# Patient Record
Sex: Female | Born: 1980 | Race: Black or African American | Hispanic: No | Marital: Married | Smoking: Never smoker
Health system: Southern US, Community
[De-identification: ages and names within clinical notes are randomized; demographics above are authoritative.]

## PROBLEM LIST (undated history)

## (undated) DIAGNOSIS — J45909 Unspecified asthma, uncomplicated: Secondary | ICD-10-CM

## (undated) HISTORY — DX: Unspecified asthma, uncomplicated: J45.909

## (undated) HISTORY — PX: CHOLECYSTECTOMY: SHX55

---

## 2001-12-18 HISTORY — PX: TUBAL LIGATION: SHX77

## 2004-04-10 ENCOUNTER — Emergency Department (HOSPITAL_COMMUNITY): Admission: EM | Admit: 2004-04-10 | Discharge: 2004-04-10 | Payer: Self-pay | Admitting: Emergency Medicine

## 2009-11-09 ENCOUNTER — Ambulatory Visit: Payer: Self-pay | Admitting: Diagnostic Radiology

## 2009-11-09 ENCOUNTER — Emergency Department (HOSPITAL_BASED_OUTPATIENT_CLINIC_OR_DEPARTMENT_OTHER): Admission: EM | Admit: 2009-11-09 | Discharge: 2009-11-09 | Payer: Self-pay | Admitting: Emergency Medicine

## 2011-06-04 ENCOUNTER — Emergency Department (HOSPITAL_BASED_OUTPATIENT_CLINIC_OR_DEPARTMENT_OTHER)
Admission: EM | Admit: 2011-06-04 | Discharge: 2011-06-04 | Disposition: A | Discharge status: Home / Self Care | Payer: Self-pay | Attending: Emergency Medicine | Admitting: Emergency Medicine

## 2011-06-04 DIAGNOSIS — M722 Plantar fascial fibromatosis: Secondary | ICD-10-CM | POA: Insufficient documentation

## 2014-09-21 ENCOUNTER — Emergency Department (HOSPITAL_BASED_OUTPATIENT_CLINIC_OR_DEPARTMENT_OTHER)
Admission: EM | Admit: 2014-09-21 | Discharge: 2014-09-22 | Disposition: A | Discharge status: Home / Self Care | Payer: No Typology Code available for payment source | Attending: Emergency Medicine | Admitting: Emergency Medicine

## 2014-09-21 ENCOUNTER — Encounter (HOSPITAL_BASED_OUTPATIENT_CLINIC_OR_DEPARTMENT_OTHER): Payer: Self-pay | Admitting: Emergency Medicine

## 2014-09-21 DIAGNOSIS — S161XXA Strain of muscle, fascia and tendon at neck level, initial encounter: Secondary | ICD-10-CM

## 2014-09-21 DIAGNOSIS — Y9389 Activity, other specified: Secondary | ICD-10-CM | POA: Insufficient documentation

## 2014-09-21 DIAGNOSIS — S0990XA Unspecified injury of head, initial encounter: Secondary | ICD-10-CM | POA: Insufficient documentation

## 2014-09-21 DIAGNOSIS — Y92410 Unspecified street and highway as the place of occurrence of the external cause: Secondary | ICD-10-CM | POA: Insufficient documentation

## 2014-09-21 MED ORDER — CYCLOBENZAPRINE HCL 10 MG PO TABS
5.0000 mg | ORAL_TABLET | Freq: Once | ORAL | Status: DC
  Filled 2014-09-21: qty 1

## 2014-09-21 MED ORDER — IBUPROFEN 400 MG PO TABS
600.0000 mg | ORAL_TABLET | Freq: Once | ORAL | Status: AC
  Administered 2014-09-22: 600 mg via ORAL
  Filled 2014-09-21 (×2): qty 1
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
## 2014-09-21 NOTE — ED Notes (Signed)
NP at bedside.
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
## 2014-09-22 ENCOUNTER — Emergency Department (HOSPITAL_BASED_OUTPATIENT_CLINIC_OR_DEPARTMENT_OTHER): Payer: No Typology Code available for payment source

## 2014-09-22 ENCOUNTER — Encounter (HOSPITAL_BASED_OUTPATIENT_CLINIC_OR_DEPARTMENT_OTHER): Payer: Self-pay | Admitting: Emergency Medicine

## 2014-09-22 MED ORDER — CYCLOBENZAPRINE HCL 10 MG PO TABS: 10.0000 mg | ORAL_TABLET | Freq: Two times a day (BID) | ORAL | Status: DC | PRN

## 2014-09-22 MED ORDER — NAPROXEN 500 MG PO TABS: 500.0000 mg | ORAL_TABLET | Freq: Two times a day (BID) | ORAL | Status: DC

## 2014-09-22 NOTE — ED Notes (Signed)
Called radiology to check on delay in xr result.  Darius is checking on it.

## 2014-09-24 NOTE — ED Provider Notes (Signed)
Medical screening examination/treatment/procedure(s) were performed by non-physician practitioner and as supervising physician I was immediately available for consultation/collaboration.   EKG Interpretation None       Sion Thane K Merita Hawks-Rasch, MD 09/24/14 2329

## 2014-12-02 ENCOUNTER — Emergency Department (HOSPITAL_COMMUNITY)
Admission: EM | Admit: 2014-12-02 | Discharge: 2014-12-02 | Disposition: A | Discharge status: Home / Self Care | Payer: No Typology Code available for payment source | Attending: Emergency Medicine | Admitting: Emergency Medicine

## 2014-12-02 ENCOUNTER — Encounter (HOSPITAL_COMMUNITY): Payer: Self-pay | Admitting: Emergency Medicine

## 2014-12-02 DIAGNOSIS — R1013 Epigastric pain: Secondary | ICD-10-CM | POA: Insufficient documentation

## 2014-12-02 DIAGNOSIS — Z791 Long term (current) use of non-steroidal anti-inflammatories (NSAID): Secondary | ICD-10-CM | POA: Insufficient documentation

## 2014-12-02 DIAGNOSIS — D649 Anemia, unspecified: Secondary | ICD-10-CM | POA: Insufficient documentation

## 2014-12-02 DIAGNOSIS — Z9851 Tubal ligation status: Secondary | ICD-10-CM | POA: Insufficient documentation

## 2014-12-02 DIAGNOSIS — Z3202 Encounter for pregnancy test, result negative: Secondary | ICD-10-CM | POA: Insufficient documentation

## 2014-12-02 DIAGNOSIS — Z9089 Acquired absence of other organs: Secondary | ICD-10-CM | POA: Insufficient documentation

## 2014-12-02 LAB — CBC WITH DIFFERENTIAL/PLATELET
BASOS ABS: 0 10*3/uL (ref 0.0–0.1)
Basophils Relative: 0 % (ref 0–1)
EOS PCT: 1 % (ref 0–5)
Eosinophils Absolute: 0.1 10*3/uL (ref 0.0–0.7)
HEMATOCRIT: 32.3 % — AB (ref 36.0–46.0)
Hemoglobin: 9.7 g/dL — ABNORMAL LOW (ref 12.0–15.0)
LYMPHS ABS: 2.1 10*3/uL (ref 0.7–4.0)
LYMPHS PCT: 26 % (ref 12–46)
MCH: 21.8 pg — ABNORMAL LOW (ref 26.0–34.0)
MCHC: 30 g/dL (ref 30.0–36.0)
MCV: 72.7 fL — AB (ref 78.0–100.0)
MONOS PCT: 7 % (ref 3–12)
Monocytes Absolute: 0.6 10*3/uL (ref 0.1–1.0)
NEUTROS ABS: 5.2 10*3/uL (ref 1.7–7.7)
Neutrophils Relative %: 66 % (ref 43–77)
Platelets: 433 10*3/uL — ABNORMAL HIGH (ref 150–400)
RBC: 4.44 MIL/uL (ref 3.87–5.11)
RDW: 15.9 % — AB (ref 11.5–15.5)
WBC: 8 10*3/uL (ref 4.0–10.5)

## 2014-12-02 LAB — URINALYSIS, ROUTINE W REFLEX MICROSCOPIC
BILIRUBIN URINE: NEGATIVE
GLUCOSE, UA: NEGATIVE mg/dL
HGB URINE DIPSTICK: NEGATIVE
Ketones, ur: 15 mg/dL — AB
Leukocytes, UA: NEGATIVE
Nitrite: NEGATIVE
Protein, ur: NEGATIVE mg/dL
SPECIFIC GRAVITY, URINE: 1.027 (ref 1.005–1.030)
Urobilinogen, UA: 0.2 mg/dL (ref 0.0–1.0)
pH: 5.5 (ref 5.0–8.0)

## 2014-12-02 LAB — COMPREHENSIVE METABOLIC PANEL
ALBUMIN: 4.1 g/dL (ref 3.5–5.2)
ALK PHOS: 80 U/L (ref 39–117)
ALT: 13 U/L (ref 0–35)
AST: 16 U/L (ref 0–37)
Anion gap: 15 (ref 5–15)
BILIRUBIN TOTAL: 0.2 mg/dL — AB (ref 0.3–1.2)
BUN: 9 mg/dL (ref 6–23)
CHLORIDE: 103 meq/L (ref 96–112)
CO2: 21 meq/L (ref 19–32)
CREATININE: 0.95 mg/dL (ref 0.50–1.10)
Calcium: 9.5 mg/dL (ref 8.4–10.5)
GFR calc Af Amer: 90 mL/min — ABNORMAL LOW (ref >90)
GFR, EST NON AFRICAN AMERICAN: 77 mL/min — AB (ref >90)
Glucose, Bld: 94 mg/dL (ref 70–99)
POTASSIUM: 4.4 meq/L (ref 3.7–5.3)
Sodium: 139 mEq/L (ref 137–147)
Total Protein: 7.1 g/dL (ref 6.0–8.3)

## 2014-12-02 LAB — POC URINE PREG, ED: Preg Test, Ur: NEGATIVE

## 2014-12-02 LAB — LIPASE, BLOOD: Lipase: 25 U/L (ref 11–59)

## 2014-12-02 MED ORDER — PANTOPRAZOLE SODIUM 40 MG PO TBEC: 40.0000 mg | DELAYED_RELEASE_TABLET | Freq: Once | ORAL | Status: DC

## 2014-12-02 MED ORDER — OMEPRAZOLE 20 MG PO CPDR: 20.0000 mg | DELAYED_RELEASE_CAPSULE | Freq: Every day | ORAL | Status: DC

## 2014-12-02 MED ORDER — GI COCKTAIL ~~LOC~~
30.0000 mL | Freq: Once | ORAL | Status: AC
  Administered 2014-12-02: 30 mL via ORAL
  Filled 2014-12-02: qty 30

## 2014-12-02 MED ORDER — ONDANSETRON 4 MG PO TBDP
4.0000 mg | ORAL_TABLET | Freq: Once | ORAL | Status: AC
Start: 2014-12-02 — End: 2014-12-02
  Administered 2014-12-02: 4 mg via ORAL
  Filled 2014-12-02: qty 1

## 2014-12-02 MED ORDER — FERROUS SULFATE 325 (65 FE) MG PO TABS: 325.0000 mg | ORAL_TABLET | Freq: Every day | ORAL | Status: DC

## 2014-12-02 NOTE — ED Provider Notes (Signed)
CSN: [id number]     Arrival date & time 12/02/14  1424 History   First MD Initiated Contact with Patient 12/02/14 1704     Chief Complaint  Patient presents with  . Abdominal Pain     (Consider location/radiation/quality/duration/timing/severity/associated sxs/prior Treatment) HPI Comments: Patient presents today with epigastric abdominal pain.  She reports that the pain has been intermittent over the past 3 days.  She describes it as a burning pain.  Pain does not radiate.  She has taken Advil for the pain without relief.  Pain is worse after eating.  She reports associated nausea and one episode of vomiting.  No blood in her emesis.  She denies chest pain, cough, fever, chills, diarrhea, constipation, urinary symptoms, or SOB.  Past abdominal surgeries include Cholecystectomy, which she reports was done approximately 12 years ago.  She denies prior history of PUD or GERD.    Patient is a 33 y.o. female presenting with abdominal pain. The history is provided by the patient.  Abdominal Pain   History reviewed. No pertinent past medical history. Past Surgical History  Procedure Laterality Date  . Tubal ligation    . Cholecystectomy     History reviewed. No pertinent family history. History  Substance Use Topics  . Smoking status: Never Smoker   . Smokeless tobacco: Not on file  . Alcohol Use: No   OB History    No data available     Review of Systems  Gastrointestinal: Positive for abdominal pain.  All other systems reviewed and are negative.     Allergies  Review of patient's allergies indicates no known allergies.  Home Medications   Prior to Admission medications   Medication Sig Start Date End Date Taking? Authorizing Provider  cyclobenzaprine (FLEXERIL) 10 MG tablet Take 1 tablet (10 mg total) by mouth 2 (two) times daily as needed for muscle spasms. 09/22/14   Hope Bunnie Pion, NP  naproxen (NAPROSYN) 500 MG tablet Take 1 tablet (500 mg total) by mouth 2 (two) times  daily. 09/22/14   Hope Bunnie Pion, NP   BP 127/79 mmHg  Pulse 82  Temp(Src) 98.2 F (36.8 C) (Oral)  Resp 11  Ht 5' (1.524 m)  Wt 160 lb (72.576 kg)  BMI 31.25 kg/m2  SpO2 100% Physical Exam  Constitutional: She appears well-developed and well-nourished.  HENT:  Head: Normocephalic and atraumatic.  Mouth/Throat: Oropharynx is clear and moist.  Neck: Normal range of motion. Neck supple.  Cardiovascular: Normal rate, regular rhythm and normal heart sounds.   Pulmonary/Chest: Effort normal and breath sounds normal. No respiratory distress. She has no wheezes. She has no rales. She exhibits no tenderness.  Abdominal: Soft. Bowel sounds are normal. She exhibits no distension and no mass. There is tenderness in the epigastric area. There is no rebound and no guarding.  Musculoskeletal: Normal range of motion.  Neurological: She is alert.  Skin: Skin is warm and dry.  Psychiatric: She has a normal mood and affect.  Nursing note and vitals reviewed.   ED Course  Procedures (including critical care time) Labs Review Labs Reviewed  CBC WITH DIFFERENTIAL - Abnormal; Notable for the following:    Hemoglobin 9.7 (*)    HCT 32.3 (*)    MCV 72.7 (*)    MCH 21.8 (*)    RDW 15.9 (*)    Platelets 433 (*)    All other components within normal limits  COMPREHENSIVE METABOLIC PANEL - Abnormal; Notable for the following:    Total  Bilirubin 0.2 (*)    GFR calc non Af Amer 77 (*)    GFR calc Af Amer 90 (*)    All other components within normal limits  URINALYSIS, ROUTINE W REFLEX MICROSCOPIC - Abnormal; Notable for the following:    Ketones, ur 15 (*)    All other components within normal limits  LIPASE, BLOOD  POC URINE PREG, ED    Imaging Review No results found.   EKG Interpretation None     6:00 PM Reassessed patient.  She reports that the pain has improved at this time.  On exam, mild tenderness to palpation of the epigastric area.  No rebound or guarding.   MDM   Final  diagnoses:  None   Patient presents today with a chief complaint of epigastric abdominal pain.  Pain is worse after eating.  She has had a prior Cholecystectomy approximately 12 years ago.   Labs unremarkable.  Patient improved after given GI cocktail.  Patient tolerating PO liquids.  Patient stable for discharge.  Patient given Rx for Prilosec and given referral to GI.  Return precautions given.      Hyman Bible, PA-C 12/03/14 0114  Quintella Reichert, MD 12/03/14 [phone]

## 2014-12-02 NOTE — ED Notes (Signed)
Pt c/o upper to mid abd pain x 3 days worse after eating with some N/V

## 2015-04-29 ENCOUNTER — Ambulatory Visit: Payer: Self-pay | Attending: Family Medicine | Admitting: Family Medicine

## 2015-04-29 ENCOUNTER — Encounter: Payer: Self-pay | Admitting: Family Medicine

## 2015-04-29 VITALS — BP 120/73 | HR 92 | Temp 98.3°F | Resp 16 | Ht 60.0 in | Wt 159.0 lb

## 2015-04-29 DIAGNOSIS — Z114 Encounter for screening for human immunodeficiency virus [HIV]: Secondary | ICD-10-CM

## 2015-04-29 DIAGNOSIS — R0602 Shortness of breath: Secondary | ICD-10-CM

## 2015-04-29 DIAGNOSIS — Z23 Encounter for immunization: Secondary | ICD-10-CM

## 2015-04-29 DIAGNOSIS — R1031 Right lower quadrant pain: Secondary | ICD-10-CM

## 2015-04-29 LAB — IRON AND TIBC
%SAT: 5 % — AB (ref 20–55)
Iron: 22 ug/dL — ABNORMAL LOW (ref 42–145)
TIBC: 488 ug/dL — AB (ref 250–470)
UIBC: 466 ug/dL — ABNORMAL HIGH (ref 125–400)

## 2015-04-29 LAB — CBC
HCT: 31.5 % — ABNORMAL LOW (ref 36.0–46.0)
HEMOGLOBIN: 9.2 g/dL — AB (ref 12.0–15.0)
MCH: 19.7 pg — AB (ref 26.0–34.0)
MCHC: 29.2 g/dL — AB (ref 30.0–36.0)
MCV: 67.6 fL — ABNORMAL LOW (ref 78.0–100.0)
MPV: 9.7 fL (ref 8.6–12.4)
Platelets: 431 10*3/uL — ABNORMAL HIGH (ref 150–400)
RBC: 4.66 MIL/uL (ref 3.87–5.11)
RDW: 17.5 % — AB (ref 11.5–15.5)
WBC: 8.5 10*3/uL (ref 4.0–10.5)

## 2015-04-29 LAB — POCT URINALYSIS DIPSTICK
Bilirubin, UA: NEGATIVE
Glucose, UA: NEGATIVE
KETONES UA: NEGATIVE
Leukocytes, UA: NEGATIVE
Nitrite, UA: NEGATIVE
PROTEIN UA: 30
RBC UA: NEGATIVE
SPEC GRAV UA: 1.025
UROBILINOGEN UA: 0.2
pH, UA: 7

## 2015-04-29 LAB — COMPLETE METABOLIC PANEL WITH GFR
ALBUMIN: 4.2 g/dL (ref 3.5–5.2)
ALK PHOS: 65 U/L (ref 39–117)
ALT: 12 U/L (ref 0–35)
AST: 13 U/L (ref 0–37)
BILIRUBIN TOTAL: 0.4 mg/dL (ref 0.2–1.2)
BUN: 8 mg/dL (ref 6–23)
CO2: 24 meq/L (ref 19–32)
Calcium: 9.2 mg/dL (ref 8.4–10.5)
Chloride: 106 mEq/L (ref 96–112)
Creat: 0.93 mg/dL (ref 0.50–1.10)
GFR, EST NON AFRICAN AMERICAN: 80 mL/min
GFR, Est African American: >89 mL/min
Glucose, Bld: 82 mg/dL (ref 70–99)
Potassium: 4.3 mEq/L (ref 3.5–5.3)
SODIUM: 139 meq/L (ref 135–145)
TOTAL PROTEIN: 6.8 g/dL (ref 6.0–8.3)

## 2015-04-29 LAB — POCT URINE PREGNANCY: PREG TEST UR: NEGATIVE

## 2015-04-29 LAB — FERRITIN: FERRITIN: 5 ng/mL — AB (ref 10–291)

## 2015-04-29 MED ORDER — FERROUS SULFATE 325 (65 FE) MG PO TABS: 325.0000 mg | ORAL_TABLET | Freq: Two times a day (BID) | ORAL | Status: DC

## 2015-04-29 NOTE — Progress Notes (Signed)
   Subjective:    Patient ID: Joyce Blake, female    DOB: 1981-08-16, 34 y.o.   MRN: [id number] CC: RLQ pain  HPI  1. RLQ pain: x 6 months. Achy. Constant. Moderate pain. Associated with heavy menses. Heavy menses x 13 years following birth of her son. Menses became heavier 2 years ago. Has hx of R ovarian cyst.   2. SOB: x 6 months or so. SOB with exertion. No CP or cough. Has heavy menses. Has bloating and cramping during menses   Soc Hx: non smoker Med Hx: R ovarian cyst Fam Hx: PGM with "menstural problems"  Review of Systems  Constitutional: Positive for chills and fatigue. Negative for fever, diaphoresis, activity change, appetite change and unexpected weight change.  Respiratory: Positive for shortness of breath. Negative for apnea, cough, choking, chest tightness and wheezing.   Cardiovascular: Negative for chest pain, palpitations and leg swelling.  Gastrointestinal: Positive for abdominal pain. Negative for nausea, vomiting, diarrhea, constipation, blood in stool, abdominal distention, anal bleeding and rectal pain.  Endocrine: Positive for cold intolerance.  Genitourinary: Positive for menstrual problem and pelvic pain. Negative for dysuria, urgency, frequency, hematuria, flank pain, decreased urine volume, vaginal bleeding, vaginal discharge, enuresis, difficulty urinating, genital sores, vaginal pain and dyspareunia.  Allergic/Immunologic: Negative.   Neurological: Negative for dizziness and light-headedness.  Hematological: Negative.        Objective:   Physical Exam BP 120/73 mmHg  Pulse 92  Temp(Src) 98.3 F (36.8 C) (Oral)  Resp 16  Ht 5' (1.524 m)  Wt 159 lb (72.122 kg)  BMI 31.05 kg/m2  SpO2 99%  LMP 04/12/2015 General appearance: alert, cooperative and no distress Eyes: conjunctivae/corneas clear. PERRL, EOM's intact. Fundi benign. Neck: no adenopathy, supple, symmetrical, trachea midline and thyroid not enlarged, symmetric, no  tenderness/mass/nodules Lungs: clear to auscultation bilaterally Heart: regular rate and rhythm, S1, S2 normal, no murmur, click, rub or gallop Abdomen: soft. Flat. Mild RLQ tenderness w/o mass. Normal bowel sounds.  Pelvic: external genitalia normal,  Scant white vaginal discharge, cervix normal, no adnexal masses or tenderness, no cervical motion tenderness, positive findings: uterine fibroids palpable, on R lower uterus adjacent to cervix Extremities: extremities normal, atraumatic, no cyanosis or edema Pulses: 2+ and symmetric Skin: Skin color, texture, turgor normal. No rashes or lesions       Assessment & Plan:

## 2015-04-29 NOTE — Progress Notes (Signed)
Establish Care Complaining of RLQ pain and SOB  No constipated no diarrhea

## 2015-04-29 NOTE — Assessment & Plan Note (Signed)
Screening HIV ordered  

## 2015-04-29 NOTE — Patient Instructions (Signed)
Ms. Equihua,  Thank you for coming in today. It was a pleasure meeting you. I look forward to being your primary doctor.   1. Heavy bleeding, pelvic pain and shortness of breath: I suspect fibroids and SOB from anemia  Pelvic ultrasound ordered  Start iron Checking CBC  Anticipate gyn referral  Please apply for [place] discount and orange card, you can also inquire if any of your medications are on the PASS (medications assistance) list.   F/u in 2 months for pelvic pain and shortness of breath   Dr. Adrian Blackwater

## 2015-04-29 NOTE — Assessment & Plan Note (Signed)
A: RLQ pain, fibroids suspected P: Pelvic ultrasound ordered  Anticipate gyn referral

## 2015-04-29 NOTE — Assessment & Plan Note (Signed)
A: SOB suspect anemia given associated heavy menstrual bleeding. Chronic non smoker, no symptoms of PE or infectious etiology.  P:  CBC Iron studies Oral iron

## 2015-04-30 LAB — HIV ANTIBODY (ROUTINE TESTING W REFLEX): HIV: NONREACTIVE

## 2015-04-30 LAB — CERVICOVAGINAL ANCILLARY ONLY
Chlamydia: NEGATIVE
NEISSERIA GONORRHEA: NEGATIVE
WET PREP (BD AFFIRM): NEGATIVE

## 2015-04-30 LAB — CYTOLOGY - PAP

## 2015-05-04 ENCOUNTER — Ambulatory Visit (HOSPITAL_COMMUNITY)
Admission: RE | Admit: 2015-05-04 | Discharge: 2015-05-04 | Disposition: A | Discharge status: Home / Self Care | Payer: Medicaid Other | Source: Ambulatory Visit | Attending: Family Medicine | Admitting: Family Medicine

## 2015-05-04 DIAGNOSIS — R938 Abnormal findings on diagnostic imaging of other specified body structures: Secondary | ICD-10-CM | POA: Insufficient documentation

## 2015-05-04 DIAGNOSIS — D252 Subserosal leiomyoma of uterus: Secondary | ICD-10-CM | POA: Diagnosis not present

## 2015-05-04 DIAGNOSIS — R1031 Right lower quadrant pain: Secondary | ICD-10-CM | POA: Diagnosis not present

## 2015-05-04 DIAGNOSIS — N920 Excessive and frequent menstruation with regular cycle: Secondary | ICD-10-CM | POA: Diagnosis not present

## 2015-05-10 ENCOUNTER — Encounter: Payer: Self-pay | Admitting: Obstetrics and Gynecology

## 2015-05-10 ENCOUNTER — Other Ambulatory Visit: Payer: Self-pay | Admitting: Family Medicine

## 2015-05-10 DIAGNOSIS — R102 Pelvic and perineal pain: Secondary | ICD-10-CM

## 2015-05-10 DIAGNOSIS — D259 Leiomyoma of uterus, unspecified: Secondary | ICD-10-CM

## 2015-05-12 ENCOUNTER — Telehealth: Payer: Self-pay | Admitting: General Practice

## 2015-05-12 DIAGNOSIS — N920 Excessive and frequent menstruation with regular cycle: Secondary | ICD-10-CM

## 2015-05-12 DIAGNOSIS — D259 Leiomyoma of uterus, unspecified: Secondary | ICD-10-CM

## 2015-05-12 NOTE — Telephone Encounter (Signed)
-----   Message from Boykin Nearing, MD sent at 04/30/2015 10:48 AM EDT ----- Negative GC/chlam

## 2015-05-12 NOTE — Telephone Encounter (Signed)
LVM to return call.

## 2015-05-12 NOTE — Telephone Encounter (Signed)
-----   Message from Boykin Nearing, MD sent at 05/03/2015  4:17 PM EDT ----- Normal pap, HPV negative  Repeat in 5 years

## 2015-05-12 NOTE — Telephone Encounter (Signed)
Patient is calling to receive results from an ultra sound procedure she had two weeks ago; please f/u with patient

## 2015-05-12 NOTE — Telephone Encounter (Signed)
-----   Message from Boykin Nearing, MD sent at 04/30/2015  8:38 AM EDT ----- Labs confirm iron deficiency anemia, continue oral iron Normal CMP Wet prep negative Screening HIV negative

## 2015-05-13 NOTE — Telephone Encounter (Signed)
Patient called returning nurse's phone call to review results. Please f/u °

## 2015-05-18 DIAGNOSIS — N920 Excessive and frequent menstruation with regular cycle: Secondary | ICD-10-CM | POA: Insufficient documentation

## 2015-05-18 MED ORDER — NORGESTIMATE-ETH ESTRADIOL 0.25-35 MG-MCG PO TABS: 1.0000 | ORAL_TABLET | Freq: Every day | ORAL | Status: DC

## 2015-05-18 NOTE — Assessment & Plan Note (Signed)
Called patient. Gave ultrasound results of fibroids with slightly thickened endometrium. Patient still SOB, trying iron but it causes nausea on empty stomach, had a heavy and painful period that was bothersome for the first 4 days and lasted 8 days. She is not currently bleeding.   Plan: Gyn referral placed to discuss options and ? Endometrial biopsy  Iron with small meal and orange juice, it takes 8 weeks to see a response Start sprintec, skipping placebo pills. Sent to onsite pharmacy,  Patient agreed with plan and voiced understanding.

## 2015-05-18 NOTE — Assessment & Plan Note (Signed)
Called patient. Gave ultrasound results of fibroids (dominant fibroid on R consistent with majority of pelvic pain) with slightly thickened endometrium. Patient still SOB, trying iron but it causes nausea on empty stomach, had a heavy and painful period that was bothersome for the first 4 days and lasted 8 days. She is not currently bleeding.   Plan: Gyn referral placed to discuss options and ? Endometrial biopsy  Iron with small meal and orange juice, it takes 8 weeks to see a response Start sprintec, skipping placebo pills. Sent to onsite pharmacy,  Patient agreed with plan and voiced understanding.

## 2015-05-18 NOTE — Telephone Encounter (Signed)
Called patient. Gave ultrasound results of fibroids with slightly thickened endometrium. Patient still SOB, trying iron but it causes nausea on empty stomach, had a heavy and painful period that was bothersome for the first 4 days and lasted 8 days. She is not currently bleeding.   Plan: Gyn referral placed to discuss options and ? Endometrial biopsy  Iron with small meal and orange juice, it takes 8 weeks to see a response Start sprintec, skipping placebo pills. Sent to onsite pharmacy,  Patient agreed with plan and voiced understanding.

## 2015-05-25 ENCOUNTER — Ambulatory Visit: Payer: Self-pay

## 2015-06-09 ENCOUNTER — Encounter: Payer: Self-pay | Admitting: Obstetrics and Gynecology

## 2015-06-09 ENCOUNTER — Ambulatory Visit (INDEPENDENT_AMBULATORY_CARE_PROVIDER_SITE_OTHER): Payer: Self-pay | Admitting: Obstetrics and Gynecology

## 2015-06-09 VITALS — BP 137/75 | HR 60 | Temp 98.1°F | Wt 159.4 lb

## 2015-06-09 DIAGNOSIS — D259 Leiomyoma of uterus, unspecified: Secondary | ICD-10-CM

## 2015-06-09 DIAGNOSIS — N920 Excessive and frequent menstruation with regular cycle: Secondary | ICD-10-CM

## 2015-06-09 DIAGNOSIS — R102 Pelvic and perineal pain: Secondary | ICD-10-CM

## 2015-06-09 NOTE — Progress Notes (Signed)
   Subjective:    Patient ID: Joyce Blake, female    DOB: 1981/09/03, 34 y.o.   MRN: [id number]  HPI  34 yo [id number] with LMP 06/07/2015 presenting today for the evaluation of pelvic pain and menorrhagia. Patient reports monthly regular cycle that are heavy in flow. She describes the first 2 days as being the heaviest to the point that she soils her clothes and bed sheets. She describes the pain as Trista Ciocca and worst with her cycles. Her pain is mainly located in the RLQ. Patient was given OCP to take continuously by her PCP to control her menorrhagia and anemia but did not take it because she was not sure why it was prescribed.   Review of Systems See pertinent for HPI    Objective:   Physical Exam  GENERAL: Well-developed, well-nourished female in no acute distress.  ABDOMEN: Soft, nontender, nondistended. No organomegaly. PELVIC: Normal external female genitalia. Vagina is pink and rugated.  Normal discharge. Normal appearing cervix. Uterus is 12-week size. No adnexal mass or tenderness. EXTREMITIES: No cyanosis, clubbing, or edema, 2+ distal pulses.   05/04/2015 Ultrasound FINDINGS: Uterus  Measurements: 10.5 x 6.5 x 8.2 cm. There is a dominant left-sided heterogeneous echotexture structure consistent with a fibroid measuring 5.9 x 5 x 5.5 cm. Inferior to this is a smaller similar structure measuring 2.5 x 2 x 1.5 cm. There is a fundal subserosal fibroid measuring 1.8 x 1.3 x 1.3 cm.  Endometrium  Thickness: 17 mm. No endometrial fluid collections or polyps are demonstrated.  Right ovary  Measurements: 3.3 x 2.5 x 2.7 cm. Normal appearance/no adnexal mass.  Left ovary  Measurements: 3.5 x 2.4 x 3.5 cm. Normal appearance/no adnexal mass.  Other findings  There is a small amount of free pelvic fluid  IMPRESSION: 1. There are multiple uterine fibroids. The dominant right-sided structure has developed since the previous ultrasound in 2012. 2. There is  thickening of the endometrium. If bleeding remains unresponsive to hormonal or medical therapy, focal lesion work-up with sonohysterogram should be considered. Endometrial biopsy should also be considered in pre-menopausal patients at high risk for endometrial carcinoma. (Ref: Radiological Reasoning: Algorithmic Workup of Abnormal Vaginal Bleeding with Endovaginal Sonography and Sonohysterography. AJR 2008; [id number]:[id number]) 3. The ovaries are normal in size and echotexture. There is a small amount of free pelvic fluid.       Assessment & Plan:  34 yo with fibroid uterus and pelvic pain - Discussed medical management with continuous OCP (as prescribed by Dr. Adrian Blackwater) vs surgical option with hysterectomy - patient desires to pursue medical management first but will return if persistent bleeding occurs - Informed patient that OCP will not have any significant impact on her Thaila Bottoms pain - RTC in 6 months or prn

## 2015-09-09 ENCOUNTER — Emergency Department (INDEPENDENT_AMBULATORY_CARE_PROVIDER_SITE_OTHER)
Admission: EM | Admit: 2015-09-09 | Discharge: 2015-09-09 | Disposition: A | Discharge status: Home / Self Care | Payer: Medicaid Other | Source: Home / Self Care | Attending: Family Medicine | Admitting: Family Medicine

## 2015-09-09 ENCOUNTER — Encounter (HOSPITAL_COMMUNITY): Payer: Self-pay | Admitting: Emergency Medicine

## 2015-09-09 DIAGNOSIS — G44209 Tension-type headache, unspecified, not intractable: Secondary | ICD-10-CM

## 2015-09-09 DIAGNOSIS — M62838 Other muscle spasm: Secondary | ICD-10-CM | POA: Diagnosis not present

## 2015-09-09 DIAGNOSIS — H6092 Unspecified otitis externa, left ear: Secondary | ICD-10-CM

## 2015-09-09 MED ORDER — NEOMYCIN-POLYMYXIN-HC 3.5-10000-1 OT SOLN: OTIC | Status: DC

## 2015-09-09 MED ORDER — DICLOFENAC SODIUM 75 MG PO TBEC: 75.0000 mg | DELAYED_RELEASE_TABLET | Freq: Two times a day (BID) | ORAL | Status: AC

## 2015-09-09 MED ORDER — METHOCARBAMOL 500 MG PO TABS
500.0000 mg | ORAL_TABLET | Freq: Four times a day (QID) | ORAL | Status: DC | PRN
Start: 2015-09-09 — End: 2015-10-05

## 2015-09-09 NOTE — ED Provider Notes (Signed)
CSN: [id number]     Arrival date & time 09/09/15  1434 History   First MD Initiated Contact with Patient 09/09/15 1456     Chief Complaint  Patient presents with  . Nausea  . Headache   (Consider location/radiation/quality/duration/timing/severity/associated sxs/prior Treatment) HPI   "Head pressure." started in upper back part of head. Radiation down neck. Associated w/ tight upper L back muscles. Ice pad and massage w/ improvement. Ongoing for past 4-5 days. Comes and goes. Motrin 800, alka seltzer w/ some improvement.  No change in physical exertion.   L ear pain and pressure. Comes and goes No discharge   Past Medical History  Diagnosis Date  . Asthma Dx 2014   Past Surgical History  Procedure Laterality Date  . Cholecystectomy    . Tubal ligation  2003    Family History  Problem Relation Age of Onset  . Asthma Father    Social History  Substance Use Topics  . Smoking status: Never Smoker   . Smokeless tobacco: Never Used  . Alcohol Use: No   OB History    Gravida Para Term Preterm AB TAB SAB Ectopic Multiple Living   6 3 0 3 3 3 0 0 0 3      Review of Systems Per HPI with all other pertinent systems negative.   Allergies  Review of patient's allergies indicates no known allergies.  Home Medications   Prior to Admission medications   Medication Sig Start Date End Date Taking? Authorizing Dajana Gehrig  diclofenac (VOLTAREN) 75 MG EC tablet Take 1 tablet (75 mg total) by mouth 2 (two) times daily. 09/09/15   Waldemar Dickens, MD  ferrous sulfate 325 (65 FE) MG tablet Take 1 tablet (325 mg total) by mouth 2 (two) times daily with a meal. 04/29/15   Boykin Nearing, MD  methocarbamol (ROBAXIN) 500 MG tablet Take 1-2 tablets (500-1,000 mg total) by mouth every 6 (six) hours as needed for muscle spasms. 09/09/15   Waldemar Dickens, MD  neomycin-polymyxin-hydrocortisone (CORTISPORIN) otic solution 3-4 Drops, 4 times per day for 7 days 09/09/15   Waldemar Dickens, MD   norgestimate-ethinyl estradiol ([place] 28) 0.25-35 MG-MCG tablet Take 1 tablet by mouth daily. Patient not taking: Reported on 06/09/2015 05/18/15   Boykin Nearing, MD   Meds Ordered and Administered this Visit  Medications - No data to display  BP 119/74 mmHg  Pulse 62  Temp(Src) 97.7 F (36.5 C) (Oral)  Resp 16  SpO2 98%  LMP 08/29/2015 No data found.   Physical Exam Physical Exam  Constitutional: oriented to person, place, and time. appears well-developed and well-nourished. No distress.  HENT:  Head: Normocephalic and atraumatic.  L peri-tympanic membrane erythema and injection. Tenderness with speculum exam Eyes: EOMI. PERRL.  Neck: Normal range of motion.  Cardiovascular: RRR, no m/r/g, 2+ distal pulses,  Pulmonary/Chest: Effort normal and breath sounds normal. No respiratory distress.  Abdominal: Soft. Bowel sounds are normal. NonTTP, no distension.  Musculoskeletal: Normal range of motion. Non ttp, no effusion.  Neurological: alert and oriented to person, place, and time.  Skin: Skin is warm. No rash noted. non diaphoretic.  Psychiatric: normal mood and affect. behavior is normal. Judgment and thought content normal.   ED Course  Procedures (including critical care time)  Labs Review Labs Reviewed - No data to display  Imaging Review No results found.   Visual Acuity Review  Right Eye Distance:   Left Eye Distance:   Bilateral Distance:    Right  Eye Near:   Left Eye Near:    Bilateral Near:         MDM   1. Tension headache   2. Muscle spasm   3. Otitis externa, left    Voltaren, Robaxin, heat, massage, stretches, Cortisporin.    Waldemar Dickens, MD 09/09/15 [phone]

## 2015-09-09 NOTE — ED Notes (Addendum)
C/o back of head pain and nausea for 4-5 days  States back of head feels like pressure Pain radiates to neck and back of left shoulder Motrin used as tx States every time she eats she gets nausea

## 2015-09-09 NOTE — Discharge Instructions (Signed)
The cause of your symptoms is likely from a combination of muscle tension and spasm and tension headache. Please start taking Tylenol 1000 mg every 6-8 hours as well as the Robaxin and Voltaren as prescribed. Please also use the eardrops for your ear infection. Please continue other medications as prescribed. Please go to the emergency room if you get worse.
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
## 2015-10-05 ENCOUNTER — Encounter: Payer: Self-pay | Admitting: Family Medicine

## 2015-10-05 ENCOUNTER — Ambulatory Visit: Payer: Medicaid Other | Attending: Family Medicine | Admitting: Family Medicine

## 2015-10-05 ENCOUNTER — Encounter (HOSPITAL_BASED_OUTPATIENT_CLINIC_OR_DEPARTMENT_OTHER): Payer: Self-pay | Admitting: Clinical

## 2015-10-05 VITALS — BP 118/77 | HR 81 | Temp 98.6°F | Resp 16 | Ht 60.0 in | Wt 162.0 lb

## 2015-10-05 DIAGNOSIS — R202 Paresthesia of skin: Secondary | ICD-10-CM | POA: Diagnosis not present

## 2015-10-05 DIAGNOSIS — M542 Cervicalgia: Secondary | ICD-10-CM | POA: Insufficient documentation

## 2015-10-05 DIAGNOSIS — D509 Iron deficiency anemia, unspecified: Secondary | ICD-10-CM | POA: Insufficient documentation

## 2015-10-05 DIAGNOSIS — Z658 Other specified problems related to psychosocial circumstances: Secondary | ICD-10-CM

## 2015-10-05 DIAGNOSIS — F439 Reaction to severe stress, unspecified: Secondary | ICD-10-CM | POA: Insufficient documentation

## 2015-10-05 DIAGNOSIS — Z79899 Other long term (current) drug therapy: Secondary | ICD-10-CM | POA: Insufficient documentation

## 2015-10-05 DIAGNOSIS — R5383 Other fatigue: Secondary | ICD-10-CM | POA: Diagnosis not present

## 2015-10-05 DIAGNOSIS — E611 Iron deficiency: Secondary | ICD-10-CM | POA: Insufficient documentation

## 2015-10-05 LAB — CBC
HEMATOCRIT: 32.6 % — AB (ref 36.0–46.0)
HEMOGLOBIN: 10.1 g/dL — AB (ref 12.0–15.0)
MCH: 22.3 pg — AB (ref 26.0–34.0)
MCHC: 31 g/dL (ref 30.0–36.0)
MCV: 72.1 fL — AB (ref 78.0–100.0)
MPV: 9.6 fL (ref 8.6–12.4)
Platelets: 409 10*3/uL — ABNORMAL HIGH (ref 150–400)
RBC: 4.52 MIL/uL (ref 3.87–5.11)
RDW: 17 % — ABNORMAL HIGH (ref 11.5–15.5)
WBC: 9.5 10*3/uL (ref 4.0–10.5)

## 2015-10-05 LAB — FERRITIN: FERRITIN: 8 ng/mL — AB (ref 10–291)

## 2015-10-05 LAB — IRON AND TIBC
%SAT: 5 % — AB (ref 11–50)
Iron: 26 ug/dL — ABNORMAL LOW (ref 40–190)
TIBC: 490 ug/dL — AB (ref 250–450)
UIBC: 464 ug/dL — AB (ref 125–400)

## 2015-10-05 LAB — POCT GLYCOSYLATED HEMOGLOBIN (HGB A1C): Hemoglobin A1C: 5.5

## 2015-10-05 LAB — GLUCOSE, POCT (MANUAL RESULT ENTRY): POC GLUCOSE: 91 mg/dL (ref 70–99)

## 2015-10-05 MED ORDER — FERROUS SULFATE 325 (65 FE) MG PO TABS: 325.0000 mg | ORAL_TABLET | Freq: Two times a day (BID) | ORAL | Status: AC

## 2015-10-05 NOTE — Progress Notes (Signed)
ASSESSMENT: Pt currently experiencing symptoms of stress, needs to f/u with PCP and [facility]; would benefit from psychoeducation and supportive counseling regarding coping with symptoms of stress.  Stage of Change: contemplative  PLAN: 1. F/U with behavioral health consultant in as needed 2. Psychiatric Medications: none. 3. Behavioral recommendation(s):   -Do relaxation breathing techniques daily -Consider reading educational material regarding coping with symptoms of stress SUBJECTIVE: Pt. referred by Dr Adrian Blackwater for stress:  Pt. reports the following symptoms/concerns: Pt states that she feels no energy, often tired, has 3 children (husband has 7 grown), and 3 grandchildren, and feels like she is constantly on the move, without adequate rest.  Duration of problem: At least one year Severity: moderate  OBJECTIVE: Orientation & Cognition: Oriented x3. Thought processes normal and appropriate to situation. Mood: appropriate. Affect: appropriate Appearance: appropriate Risk of harm to self or others: no risk of harm to self or others Substance use: none Assessments administered: PHQ2: 0  Diagnosis: Stress CPT Code: Z65.8 -------------------------------------------- Other(s) present in the room: none  Time spent with patient in exam room: 20 minutes

## 2015-10-05 NOTE — Progress Notes (Signed)
Patient ID: Joyce Blake, female   DOB: 1981/12/08, 34 y.o.   MRN: [id number]   Subjective:  Patient ID: Joyce Blake, female    DOB: January 17, 1981  Age: 34 y.o. MRN: [id number]  CC: Neck Pain   HPI Joyce Blake presents for   1. Neck pain: R sided posterior neck and upper back pain. Comes and goes. Associated with life stressors. Associated with tingling down arm at times. Massage helps temporarily.   2. IDA: ran out of iron. Fatigue. No lightheadedness or dizziness. No heavy bleeding.  Social History  Substance Use Topics  . Smoking status: Never Smoker   . Smokeless tobacco: Never Used  . Alcohol Use: No    Outpatient Prescriptions Prior to Visit  Medication Sig Dispense Refill  . diclofenac (VOLTAREN) 75 MG EC tablet Take 1 tablet (75 mg total) by mouth 2 (two) times daily. 60 tablet 0  . ferrous sulfate 325 (65 FE) MG tablet Take 1 tablet (325 mg total) by mouth 2 (two) times daily with a meal. (Patient not taking: Reported on 10/05/2015) 60 tablet 3  . methocarbamol (ROBAXIN) 500 MG tablet Take 1-2 tablets (500-1,000 mg total) by mouth every 6 (six) hours as needed for muscle spasms. (Patient not taking: Reported on 10/05/2015) 60 tablet 0  . neomycin-polymyxin-hydrocortisone (CORTISPORIN) otic solution 3-4 Drops, 4 times per day for 7 days (Patient not taking: Reported on 10/05/2015) 10 mL 0  . norgestimate-ethinyl estradiol (SPRINTEC 28) 0.25-35 MG-MCG tablet Take 1 tablet by mouth daily. (Patient not taking: Reported on 06/09/2015) 3 Package 2   No facility-administered medications prior to visit.    ROS Review of Systems  Constitutional: Positive for fatigue. Negative for fever, chills and unexpected weight change.  Eyes: Negative for visual disturbance.  Respiratory: Negative for shortness of breath.   Cardiovascular: Negative for chest pain.  Gastrointestinal: Positive for nausea. Negative for vomiting, abdominal pain and blood in stool.  Musculoskeletal:  Positive for neck pain. Negative for back pain and arthralgias.       Knot on  Side of neck with spine tingling. No pain   Skin: Negative for rash.  Allergic/Immunologic: Negative for immunocompromised state.  Hematological: Negative for adenopathy. Does not bruise/bleed easily.  Psychiatric/Behavioral: Negative for suicidal ideas and dysphoric mood.    Objective:  BP 118/77 mmHg  Pulse 81  Temp(Src) 98.6 F (37 C) (Oral)  Resp 16  Ht 5' (1.524 m)  Wt 162 lb (73.483 kg)  BMI 31.64 kg/m2  SpO2 100%  LMP 09/26/2015  BP/Weight 10/05/2015 09/09/2015 7/37/1062  Systolic BP 694 854 627  Diastolic BP 77 74 75  Wt. (Lbs) 162 - 159.4  BMI 31.64 - 31.13     Physical Exam  Constitutional: She is oriented to person, place, and time. She appears well-developed and well-nourished. No distress.  HENT:  Head: Normocephalic and atraumatic.  Neck: Normal range of motion and full passive range of motion without pain. Neck supple.    Cardiovascular: Normal rate, regular rhythm, normal heart sounds and intact distal pulses.   Pulmonary/Chest: Effort normal and breath sounds normal.  Musculoskeletal: She exhibits no edema.  Neurological: She is alert and oriented to person, place, and time.  Skin: Skin is warm and dry. No rash noted.  Psychiatric: She has a normal mood and affect.   Lab Results  Component Value Date   HGBA1C 5.50 10/05/2015    Assessment & Plan:   Problem List Items Addressed This Visit    Fatigue - Primary  Relevant Orders   Glucose (CBG) (Completed)   HgB A1c (Completed)   IDA (iron deficiency anemia)   Relevant Medications   ferrous sulfate 325 (65 FE) MG tablet   Other Relevant Orders   Iron and TIBC (Completed)   Ferritin (Completed)   CBC (Completed)   Stress    For stress: Exercise Look into outsourcing duties around the house Set aside 10-15 minute 1-3 times a day for peace and relaxation. This can even be in the car with peaceful music or no music  at all.           No orders of the defined types were placed in this encounter.    Follow-up: No Follow-up on file.   Boykin Nearing MD

## 2015-10-05 NOTE — Progress Notes (Signed)
Requesting Iron check  Complaining of no energy  Knot on lt side of neck, neck tingling down spine  No pain

## 2015-10-05 NOTE — Patient Instructions (Addendum)
Joyce Blake was seen today for neck pain.  Diagnoses and all orders for this visit:  Other fatigue -     Glucose (CBG) -     HgB A1c  IDA (iron deficiency anemia) -     Iron and TIBC -     Ferritin -     CBC -     ferrous sulfate 325 (65 FE) MG tablet; Take 1 tablet (325 mg total) by mouth 2 (two) times daily with a meal.   For stress: Exercise Look into outsourcing duties around the house Set aside 10-15 minute 1-3 times a day for peace and relaxation. This can even be in the car with peaceful music or no music at all.   You will be called with lab results. I have refilled iron.   Dr. Adrian Blackwater

## 2015-10-05 NOTE — Assessment & Plan Note (Signed)
For stress: Exercise Look into outsourcing duties around the house Set aside 10-15 minute 1-3 times a day for peace and relaxation. This can even be in the car with peaceful music or no music at all.

## 2015-10-06 ENCOUNTER — Telehealth: Payer: Self-pay | Admitting: *Deleted

## 2015-10-06 NOTE — Telephone Encounter (Signed)
Date of birth verified by pt Iron levels improved but ar still low Continue taking iron as prescribed  Pt verbalized understanding

## 2015-10-06 NOTE — Telephone Encounter (Signed)
-----   Message from Boykin Nearing, MD sent at 10/06/2015  8:22 AM EDT ----- Iron levels have improved but are still low Continue iron indefinitely. If you run out call for refill or buy OTC

## 2016-05-15 IMAGING — US US PELVIS COMPLETE
1 series · 13 of 25 positions shown · non-contrast
Comparison: Report of a pelvic ultrasound February 03, 2011

CLINICAL DATA: Right lower quadrant and pelvic pain with
menorrhagia

EXAM:
TRANSABDOMINAL AND TRANSVAGINAL ULTRASOUND OF PELVIS
TECHNIQUE: Both transabdominal and transvaginal ultrasound examinations of the
pelvis were performed. Transabdominal technique was performed for
global imaging of the pelvis including uterus, ovaries, adnexal
regions, and pelvic cul-de-sac. It was necessary to proceed with
endovaginal exam following the transabdominal exam to visualize the
endometrium and adnexal structures.

[Series 1: us pelvis complete · 0.24mm/px · 13 of 111 slices shown]
[im 1/111]
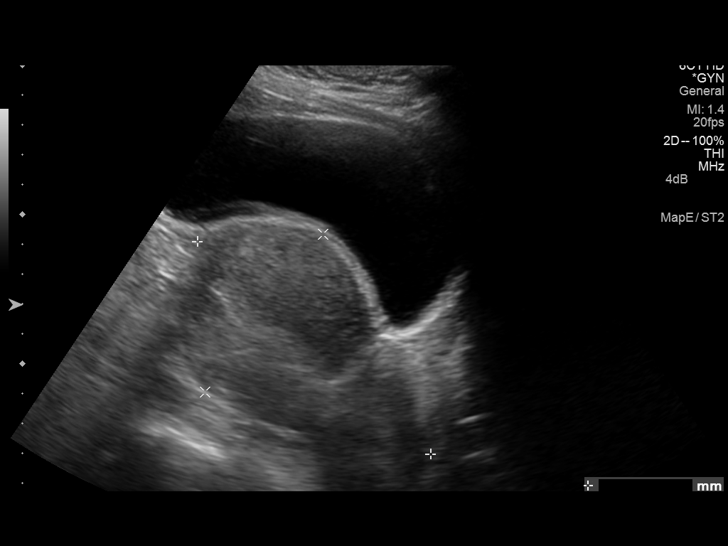
[im 10/111]
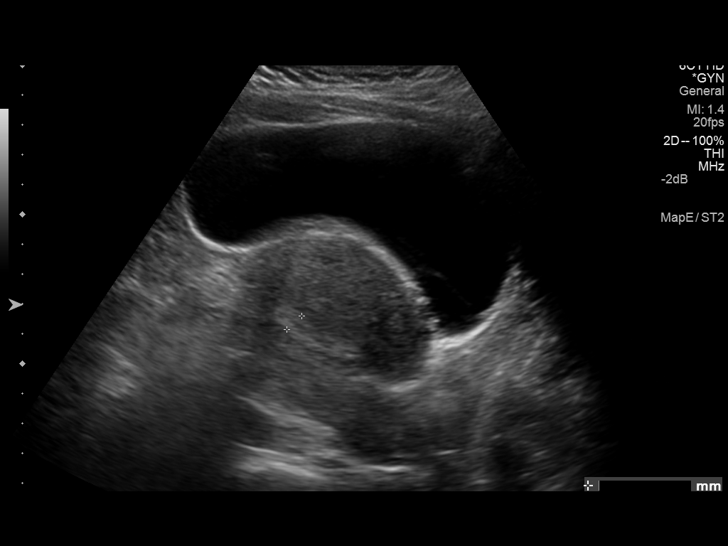
[im 19/111]
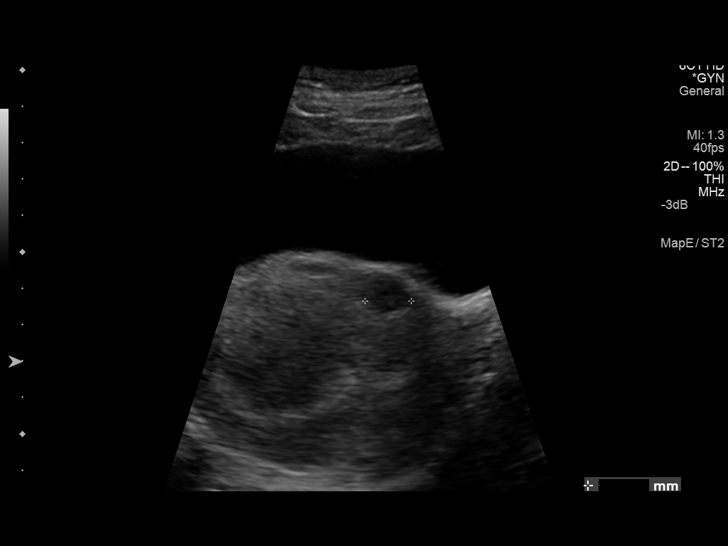
[im 28/111]
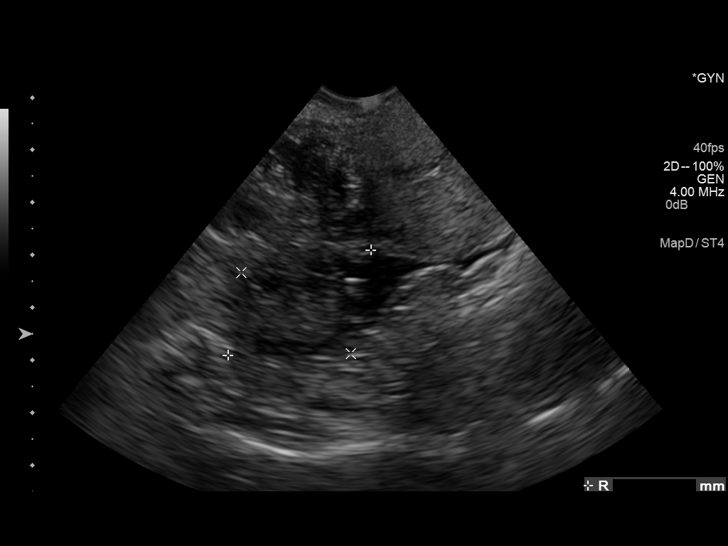
[im 37/111]
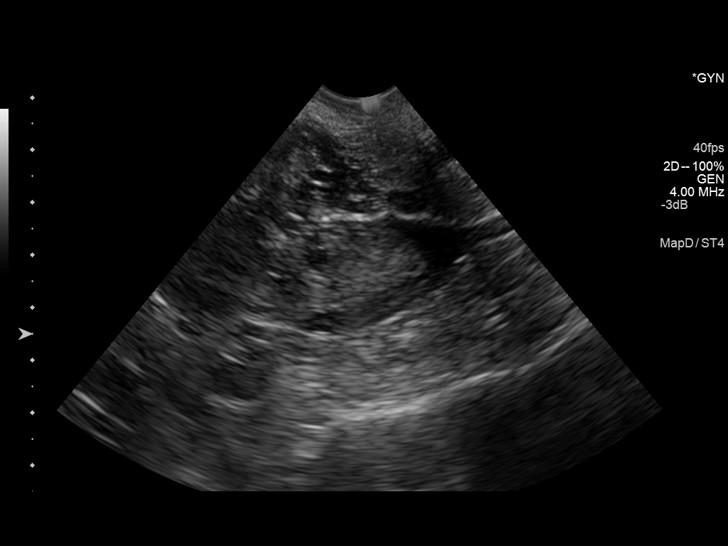
[im 46/111]
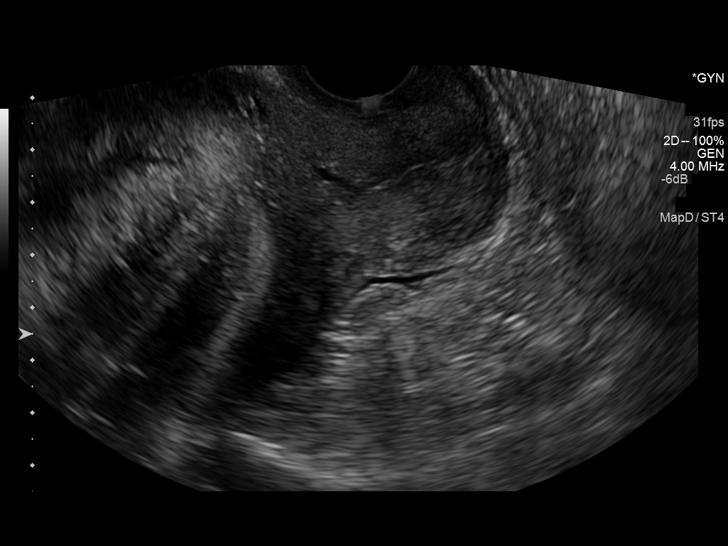
[im 56/111]
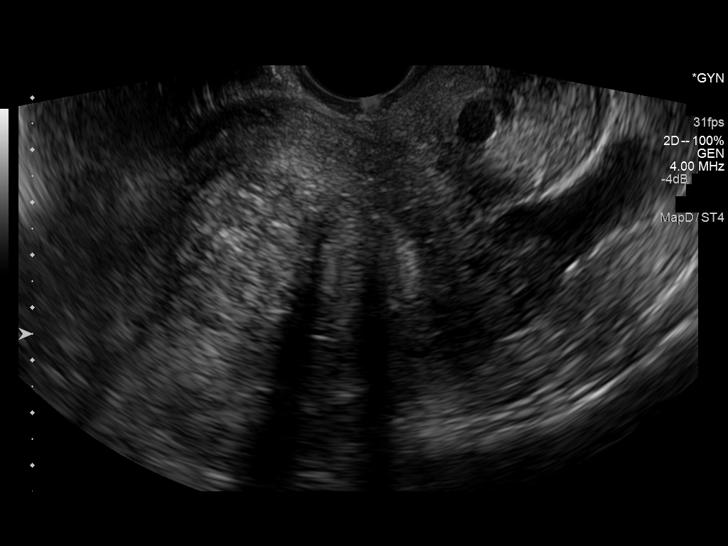
[im 65/111]
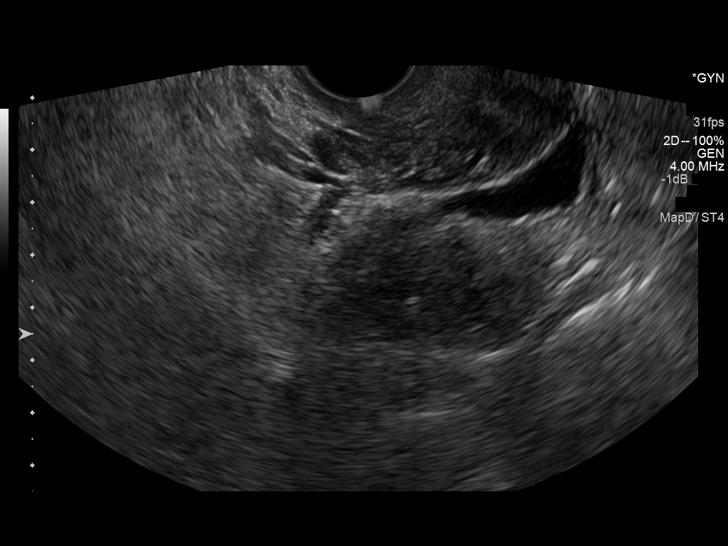
[im 74/111]
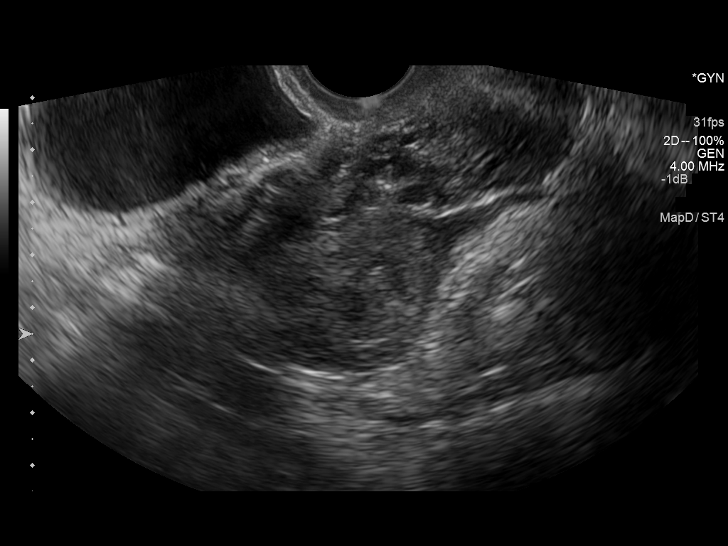
[im 83/111]
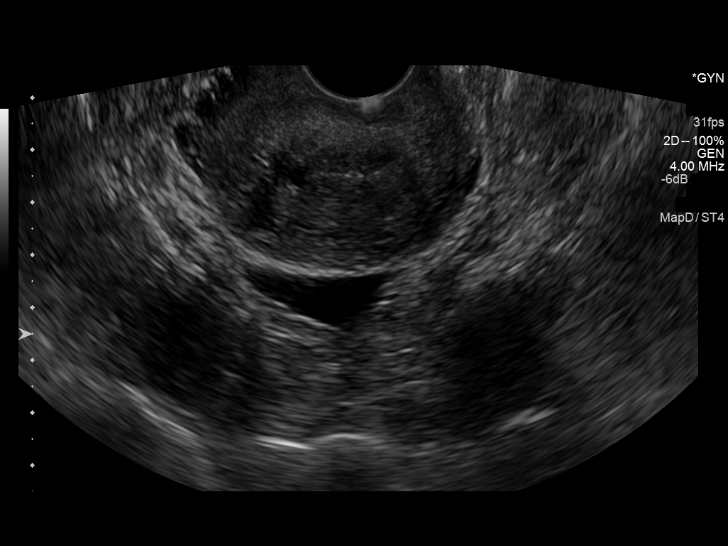
[im 92/111]
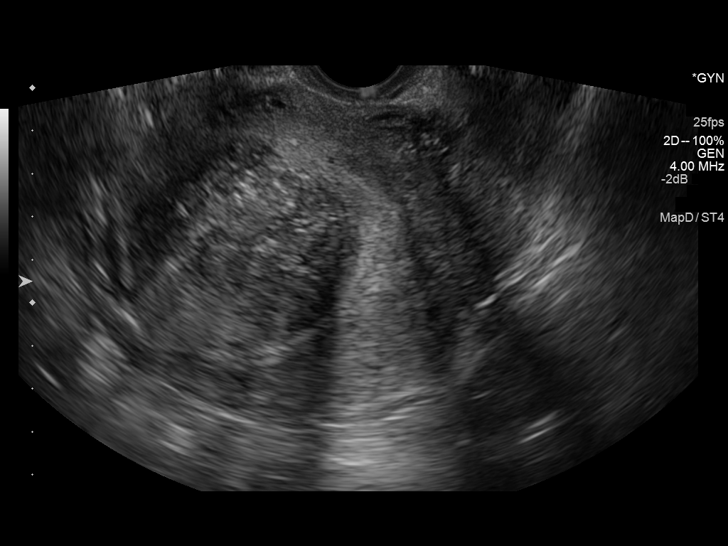
[im 101/111]
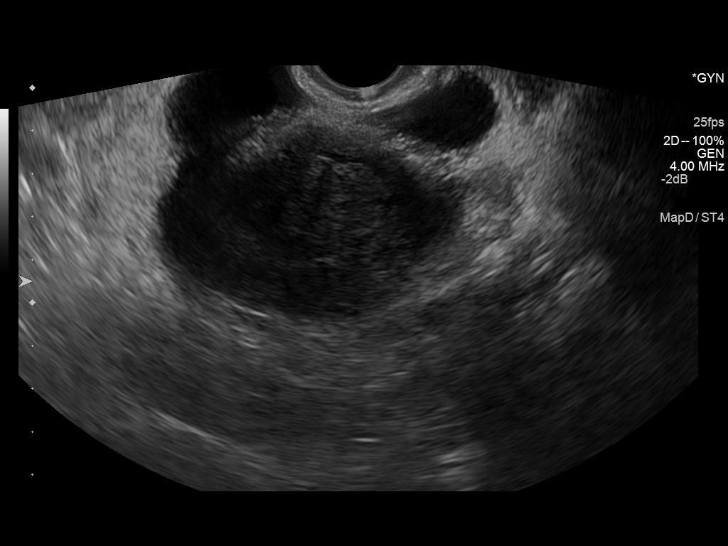
[im 111/111]
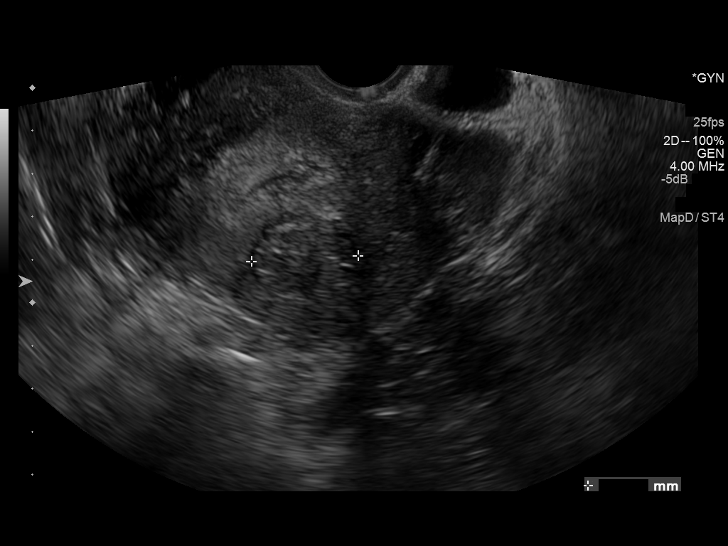

[13 of 25 positions shown; findings below may reference images not displayed]

FINDINGS: Uterus

Measurements: 10.5 x 6.5 x 8.2 cm. There is a dominant left-sided
heterogeneous echotexture structure consistent with a fibroid
measuring 5.9 x 5 x 5.5 cm. Inferior to this is a smaller similar
structure measuring 2.5 x 2 x 1.5 cm. There is a fundal subserosal
fibroid measuring 1.8 x 1.3 x 1.3 cm.

Endometrium

Thickness: 17 mm. No endometrial fluid collections or polyps are
demonstrated.

Right ovary

Measurements: 3.3 x 2.5 x 2.7 cm. Normal appearance/no adnexal mass.

Left ovary

Measurements: 3.5 x 2.4 x 3.5 cm. Normal appearance/no adnexal mass.

Other findings

There is a small amount of free pelvic fluid
IMPRESSION: 1. There are multiple uterine fibroids. The dominant right-sided
structure has developed since the previous ultrasound in 1791.
2. There is thickening of the endometrium. If bleeding remains
unresponsive to hormonal or medical therapy, focal lesion work-up
with sonohysterogram should be considered. Endometrial biopsy should
also be considered in pre-menopausal patients at high risk for
endometrial carcinoma. (Ref: Radiological Reasoning: Algorithmic
Workup of Abnormal Vaginal Bleeding with Endovaginal Sonography and
Sonohysterography. AJR 1226; 191:S68-73)
3. The ovaries are normal in size and echotexture. There is a small
amount of free pelvic fluid.

## 2023-08-02 ENCOUNTER — Emergency Department (HOSPITAL_COMMUNITY)
Admission: EM | Admit: 2023-08-02 | Discharge: 2023-08-03 | Disposition: A | Discharge status: Home / Self Care | Payer: Self-pay | Attending: Emergency Medicine | Admitting: Emergency Medicine

## 2023-08-02 ENCOUNTER — Emergency Department (HOSPITAL_COMMUNITY): Payer: Self-pay

## 2023-08-02 ENCOUNTER — Encounter (HOSPITAL_COMMUNITY): Payer: Self-pay

## 2023-08-02 ENCOUNTER — Other Ambulatory Visit: Payer: Self-pay

## 2023-08-02 DIAGNOSIS — W19XXXA Unspecified fall, initial encounter: Secondary | ICD-10-CM | POA: Insufficient documentation

## 2023-08-02 DIAGNOSIS — J45909 Unspecified asthma, uncomplicated: Secondary | ICD-10-CM | POA: Insufficient documentation

## 2023-08-02 DIAGNOSIS — S93402A Sprain of unspecified ligament of left ankle, initial encounter: Secondary | ICD-10-CM | POA: Insufficient documentation

## 2023-08-02 NOTE — ED Provider Notes (Signed)
[place] EMERGENCY DEPARTMENT AT [facility] Provider Note   CSN: [id number] Arrival date & time: 08/02/23  1953     History  Chief Complaint  Patient presents with   Ankle Injury    Joyce Blake is a 42 y.o. female with medical history of asthma.  Patient presents to ED for evaluation of left ankle injury.  Patient reports that she was stepping out of a truck earlier when she missed a step causing her to fall onto her left ankle.  She denies falling or hitting her head.  She reports that she has swelling to her left ankle.  She denies medications prior to arrival.  Denies history of surgery to left ankle.   Ankle Injury       Home Medications Prior to Admission medications   Medication Sig Start Date End Date Taking? Authorizing Provider  diclofenac (VOLTAREN) 75 MG EC tablet Take 1 tablet (75 mg total) by mouth 2 (two) times daily. 09/09/15   Ozella Rocks, MD  ferrous sulfate 325 (65 FE) MG tablet Take 1 tablet (325 mg total) by mouth 2 (two) times daily with a meal. 10/05/15   Dessa Phi, MD      Allergies    Patient has no known allergies.    Review of Systems   Review of Systems  Musculoskeletal:  Positive for arthralgias and myalgias.  All other systems reviewed and are negative.   Physical Exam Updated Vital Signs BP 130/82 (BP Location: Left Arm)   Pulse 69   Temp 99 F (37.2 C) (Oral)   Resp 16   Ht 5' (1.524 m)   Wt 78 kg   LMP 07/19/2023 (Approximate)   SpO2 100%   BMI 33.59 kg/m  Physical Exam Vitals and nursing note reviewed.  Constitutional:      General: She is not in acute distress.    Appearance: Normal appearance. She is not ill-appearing, toxic-appearing or diaphoretic.  HENT:     Head: Normocephalic and atraumatic.     Nose: Nose normal.     Mouth/Throat:     Mouth: Mucous membranes are moist.     Pharynx: Oropharynx is clear.  Eyes:     Extraocular Movements: Extraocular movements intact.      Conjunctiva/sclera: Conjunctivae normal.     Pupils: Pupils are equal, round, and reactive to light.  Cardiovascular:     Rate and Rhythm: Normal rate and regular rhythm.  Pulmonary:     Effort: Pulmonary effort is normal.     Breath sounds: Normal breath sounds. No wheezing.  Abdominal:     General: Abdomen is flat. Bowel sounds are normal.     Palpations: Abdomen is soft.     Tenderness: There is no abdominal tenderness.  Musculoskeletal:        General: Swelling present.     Cervical back: Normal range of motion and neck supple. No tenderness.     Comments: Soft tissue swelling to lateral side of left ankle.  Reduced range of motion secondary to pain and swelling.  No obvious deformity.  2+ DP pulse in the left foot.  Skin:    General: Skin is warm and dry.     Capillary Refill: Capillary refill takes less than 2 seconds.  Neurological:     Mental Status: She is alert and oriented to person, place, and time.     ED Results / Procedures / Treatments   Labs (all labs ordered are listed, but only abnormal results  are displayed) Labs Reviewed - No data to display  EKG None  Radiology DG Ankle Complete Left  Result Date: 08/02/2023 CLINICAL DATA:  Trauma to the left ankle. EXAM: LEFT ANKLE COMPLETE - 3+ VIEW COMPARISON:  None Available. FINDINGS: There is no acute fracture or dislocation. The bones are well mineralized. No arthritic changes. The ankle mortise is intact. Mild soft tissue swelling over the lateral ankle. No radiopaque foreign object or soft tissue gas. IMPRESSION: 1. No acute fracture or dislocation. 2. Mild soft tissue swelling over the lateral ankle. Electronically Signed   By: Elgie Collard M.D.   On: 08/02/2023 21:38    Procedures Procedures   Medications Ordered in ED Medications - No data to display  ED Course/ Medical Decision Making/ A&P  Medical Decision Making Amount and/or Complexity of Data Reviewed Radiology: ordered.   42 year old female  presents to ED for evaluation of left ankle injury.  Please see HPI for further details.  On examination the patient left ankle has swelling to the lateral portion.  There is no overlying skin change, no obvious deformity.  She does have reduced range of motion secondary to pain and swelling.  Patient plain film imaging of ankle shows soft tissue swelling however no acute fracture or dislocation.  Patient will be placed in a cam boot.  Patient reports she is here from out of town, lives in [place].  She will follow-up with her PCP upon arrival home.  She has been advised to follow-up with an orthopedic doctor.  She has been advised to employ the RICE method and I explained this to her.  Encouraged to take ibuprofen or Tylenol every 6 hours as needed for pain.  Advised to return to the ED with any new or worsening signs or symptoms.  Discharged in stable condition.   Final Clinical Impression(s) / ED Diagnoses Final diagnoses:  Sprain of left ankle, unspecified ligament, initial encounter    Rx / DC Orders ED Discharge Orders     None         Clent Ridges 08/02/23 2157    Tegeler, Canary Brim, MD 08/02/23 2233

## 2023-08-02 NOTE — Discharge Instructions (Signed)
It was a pleasure taking part in your care today.  As we discussed, the x-ray image of your left ankle is unremarkable and negative for fracture or dislocation.  I would like for you to remain in the cam boot while walking until seen by your PCP in [place].  Please take the cam boot off at night to rest.  Please elevate your leg at night to reduce swelling.  Please follow RICE protocol to include rest, ice, compression and elevation.  Please take ibuprofen or Tylenol every 6 hours as needed for pain.  Please read the attached guide concerning).

## 2023-08-02 NOTE — ED Triage Notes (Signed)
Was stepping out of truck and rolled left ankle. Swelling.
